# Patient Record
Sex: Female | Born: 1975 | Hispanic: No | Marital: Married
Health system: Southern US, Community
[De-identification: ages and names within clinical notes are randomized; demographics above are authoritative.]

---

## 2019-05-03 ENCOUNTER — Encounter: Payer: Self-pay | Admitting: Certified Nurse Midwife

## 2019-05-27 ENCOUNTER — Other Ambulatory Visit: Payer: Self-pay | Admitting: Otolaryngology

## 2019-05-27 DIAGNOSIS — R221 Localized swelling, mass and lump, neck: Secondary | ICD-10-CM

## 2019-06-07 ENCOUNTER — Ambulatory Visit: Admission: RE | Admit: 2019-06-07 | Payer: BC Managed Care – PPO | Source: Ambulatory Visit

## 2019-06-15 ENCOUNTER — Other Ambulatory Visit: Payer: Self-pay | Admitting: Otolaryngology

## 2019-06-15 ENCOUNTER — Ambulatory Visit
Admission: RE | Admit: 2019-06-15 | Discharge: 2019-06-15 | Disposition: A | Discharge status: Home / Self Care | Payer: Self-pay | Source: Ambulatory Visit | Attending: Otolaryngology | Admitting: Otolaryngology

## 2019-06-15 DIAGNOSIS — R221 Localized swelling, mass and lump, neck: Secondary | ICD-10-CM

## 2019-06-18 ENCOUNTER — Other Ambulatory Visit: Payer: Self-pay | Admitting: Radiology

## 2019-06-18 ENCOUNTER — Other Ambulatory Visit: Payer: Self-pay | Admitting: Student

## 2019-06-21 ENCOUNTER — Ambulatory Visit
Admission: RE | Admit: 2019-06-21 | Discharge: 2019-06-21 | Disposition: A | Discharge status: Home / Self Care | Payer: BC Managed Care – PPO | Source: Ambulatory Visit | Attending: Otolaryngology | Admitting: Otolaryngology

## 2019-06-21 ENCOUNTER — Other Ambulatory Visit: Payer: Self-pay

## 2019-06-21 DIAGNOSIS — R221 Localized swelling, mass and lump, neck: Secondary | ICD-10-CM | POA: Diagnosis present

## 2019-06-21 NOTE — Progress Notes (Signed)
Pt stable after Right neck mass [place] FNA.VSS.Neck stable.D/C instructions given.F/U with ENT.

## 2019-06-21 NOTE — Discharge Instructions (Signed)
Needle Biopsy, Care After °These instructions tell you how to care for yourself after your procedure. Your doctor may also give you more specific instructions. Call your doctor if you have any problems or questions. °What can I expect after the procedure? °After the procedure, it is common to have: °· Soreness. °· Bruising. °· Mild pain. °Follow these instructions at home: ° °· Return to your normal activities as told by your doctor. Ask your doctor what activities are safe for you. °· Take over-the-counter and prescription medicines only as told by your doctor. °· Wash your hands with soap and water before you change your bandage (dressing). If you cannot use soap and water, use hand sanitizer. °· Follow instructions from your doctor about: °? How to take care of your puncture site. °? When and how to change your bandage. °? When to remove your bandage. °· Check your puncture site every day for signs of infection. Watch for: °? Redness, swelling, or pain. °? Fluid or blood.  °? Pus or a bad smell. °? Warmth. °· Do not take baths, swim, or use a hot tub until your doctor approves. Ask your doctor if you may take showers. You may only be allowed to take sponge baths. °· Keep all follow-up visits as told by your doctor. This is important. °Contact a doctor if you have: °· A fever. °· Redness, swelling, or pain at the puncture site, and it lasts longer than a few days. °· Fluid, blood, or pus coming from the puncture site. °· Warmth coming from the puncture site. °Get help right away if: °· You have a lot of bleeding from the puncture site. °Summary °· After the procedure, it is common to have soreness, bruising, or mild pain at the puncture site. °· Check your puncture site every day for signs of infection, such as redness, swelling, or pain. °· Get help right away if you have severe bleeding from your puncture site. °This information is not intended to replace advice given to you by your health care provider. Make  sure you discuss any questions you have with your health care provider. °Document Released: 08/22/2008 Document Revised: 09/22/2017 Document Reviewed: 09/22/2017 °Elsevier Patient Education © 2020 [employer] ° °

## 2019-06-21 NOTE — Progress Notes (Signed)
Patient clinically stable post Neck biopsy per Dr Register, tolerated well without sedation per patient's wishes. Denies complaints at this time. Discharge instructions given with questions answered. bandaid dressing dry and intact.taking po's without difficulty.

## 2019-06-23 LAB — CYTOLOGY - NON PAP

## 2019-06-26 LAB — AEROBIC/ANAEROBIC CULTURE W GRAM STAIN (SURGICAL/DEEP WOUND)

## 2019-07-08 ENCOUNTER — Other Ambulatory Visit
Admission: RE | Admit: 2019-07-08 | Discharge: 2019-07-08 | Disposition: A | Discharge status: Home / Self Care | Payer: BC Managed Care – PPO | Source: Ambulatory Visit | Attending: Otolaryngology | Admitting: Otolaryngology

## 2019-07-09 ENCOUNTER — Inpatient Hospital Stay: Admission: RE | Admit: 2019-07-09 | Payer: BC Managed Care – PPO | Source: Ambulatory Visit

## 2019-07-09 ENCOUNTER — Other Ambulatory Visit
Admission: RE | Admit: 2019-07-09 | Discharge: 2019-07-09 | Disposition: A | Discharge status: Home / Self Care | Payer: BC Managed Care – PPO | Source: Ambulatory Visit | Attending: Otolaryngology | Admitting: Otolaryngology

## 2019-07-09 DIAGNOSIS — Z20828 Contact with and (suspected) exposure to other viral communicable diseases: Secondary | ICD-10-CM | POA: Diagnosis not present

## 2019-07-09 LAB — SARS CORONAVIRUS 2 (TAT 6-24 HRS): SARS Coronavirus 2: NEGATIVE

## 2019-07-14 ENCOUNTER — Encounter: Admission: RE | Payer: Self-pay | Source: Home / Self Care

## 2019-07-14 ENCOUNTER — Ambulatory Visit: Admission: RE | Admit: 2019-07-14 | Payer: BC Managed Care – PPO | Source: Home / Self Care | Admitting: Otolaryngology

## 2019-07-14 SURGERY — EXCISION, BRANCHIAL CLEFT CYST
Anesthesia: General | Laterality: Right

## 2019-12-30 ENCOUNTER — Other Ambulatory Visit: Payer: Self-pay | Admitting: Obstetrics and Gynecology

## 2019-12-30 DIAGNOSIS — N6310 Unspecified lump in the right breast, unspecified quadrant: Secondary | ICD-10-CM

## 2020-01-14 ENCOUNTER — Other Ambulatory Visit: Payer: Self-pay | Admitting: Obstetrics and Gynecology

## 2020-01-14 DIAGNOSIS — N6310 Unspecified lump in the right breast, unspecified quadrant: Secondary | ICD-10-CM

## 2020-01-24 ENCOUNTER — Ambulatory Visit
Admission: RE | Admit: 2020-01-24 | Discharge: 2020-01-24 | Disposition: A | Discharge status: Home / Self Care | Payer: BC Managed Care – PPO | Source: Ambulatory Visit | Attending: Obstetrics and Gynecology | Admitting: Obstetrics and Gynecology

## 2020-01-24 DIAGNOSIS — N6310 Unspecified lump in the right breast, unspecified quadrant: Secondary | ICD-10-CM

## 2020-06-07 ENCOUNTER — Other Ambulatory Visit: Payer: Self-pay | Admitting: Obstetrics

## 2020-06-07 DIAGNOSIS — Z1231 Encounter for screening mammogram for malignant neoplasm of breast: Secondary | ICD-10-CM

## 2020-06-16 ENCOUNTER — Ambulatory Visit: Payer: BC Managed Care – PPO | Attending: Obstetrics

## 2022-02-14 IMAGING — US US BREAST*R* LIMITED INC AXILLA
1 series · 2 of 2 positions shown · non-contrast
Comparison: 04/29/2019

CLINICAL DATA: Patient found a mass in the RIGHT breast measuring
pea-sized 5 weeks ago. Patient reports mass is now smaller. Patient
is currently breast-feeding an 18-month-old child.

EXAM:
DIGITAL DIAGNOSTIC RIGHT MAMMOGRAM WITH CAD AND TOMO
ULTRASOUND RIGHT BREAST

[Series 1: us breast*right* limited inc axilla · 0.07mm/px · 2 of 2 slices shown]
[im 1/2]
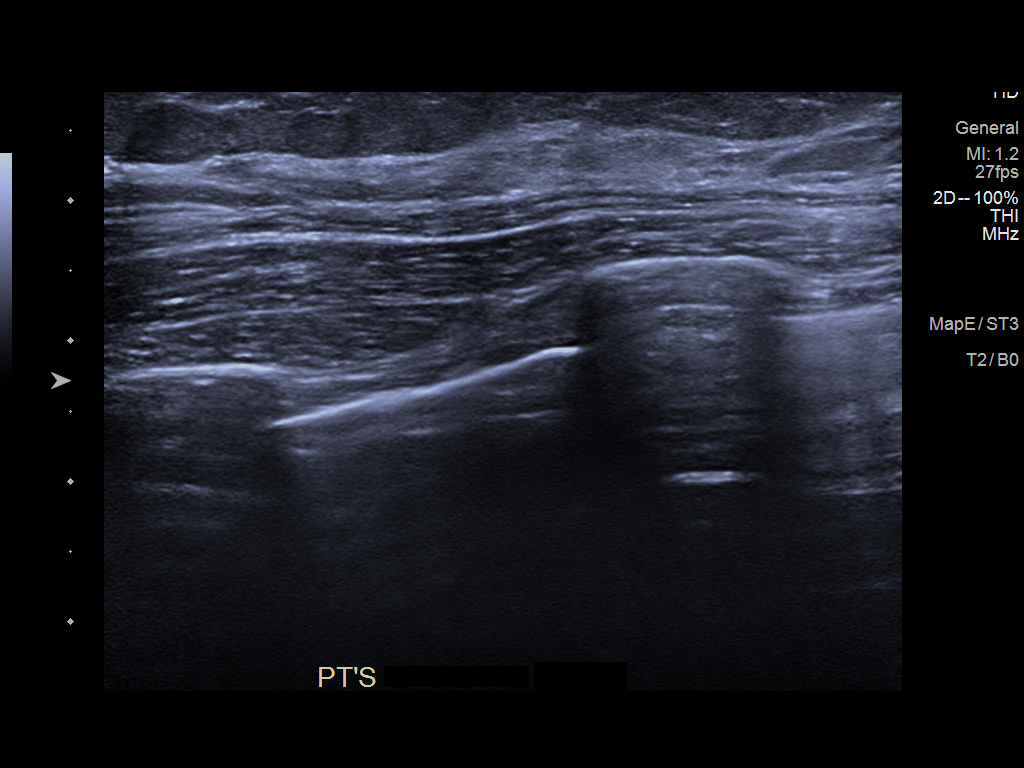
[im 2/2]
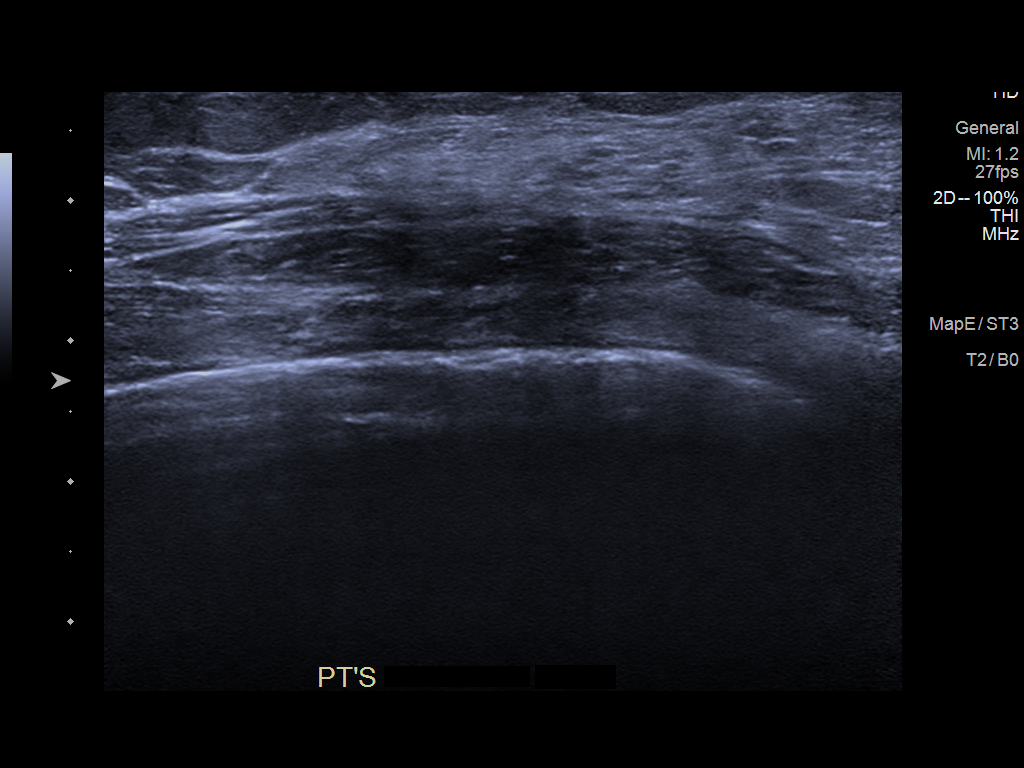

[2 of 2 positions shown; findings below may reference images not displayed]

ACR Breast Density Category c: The breast tissue is heterogeneously
dense, which may obscure small masses.
FINDINGS: No suspicious mass, distortion, or microcalcifications are
identified to suggest presence of malignancy. Spot tangential view
is performed area concern in the LATERAL portion of the RIGHT
breast, showing normal appearing fibroglandular tissue. A possible
mass in the LATERAL portion of the RIGHT breast disperses on
dedicated spot compression views.

Mammographic images were processed with CAD.

On physical exam, I palpate no discrete mass in 9 o'clock location
of the RIGHT breast.

Targeted ultrasound is performed, showing normal appearing
fibroglandular tissue in the LATERAL aspect of the RIGHT breast. No
suspicious mass, distortion, or acoustic shadowing is demonstrated
with ultrasound.
IMPRESSION: No mammographic or ultrasound evidence for malignancy.

RECOMMENDATION:
Bilateral screening mammogram is recommended in April 2020.

I have discussed the findings and recommendations with the patient.
If applicable, a reminder letter will be sent to the patient
regarding the next appointment.

BI-RADS CATEGORY  1: Negative.

## 2022-02-14 IMAGING — MG MM DIGITAL DIAGNOSTIC UNILAT*R* W/ TOMO W/ CAD
6 of 10 series · 6 of 30 positions shown · non-contrast
Comparison: 04/29/2019

CLINICAL DATA: Patient found a mass in the RIGHT breast measuring
pea-sized 5 weeks ago. Patient reports mass is now smaller. Patient
is currently breast-feeding an 18-month-old child.

EXAM:
DIGITAL DIAGNOSTIC RIGHT MAMMOGRAM WITH CAD AND TOMO
ULTRASOUND RIGHT BREAST

[R CC synth-2D (1 of 3)]
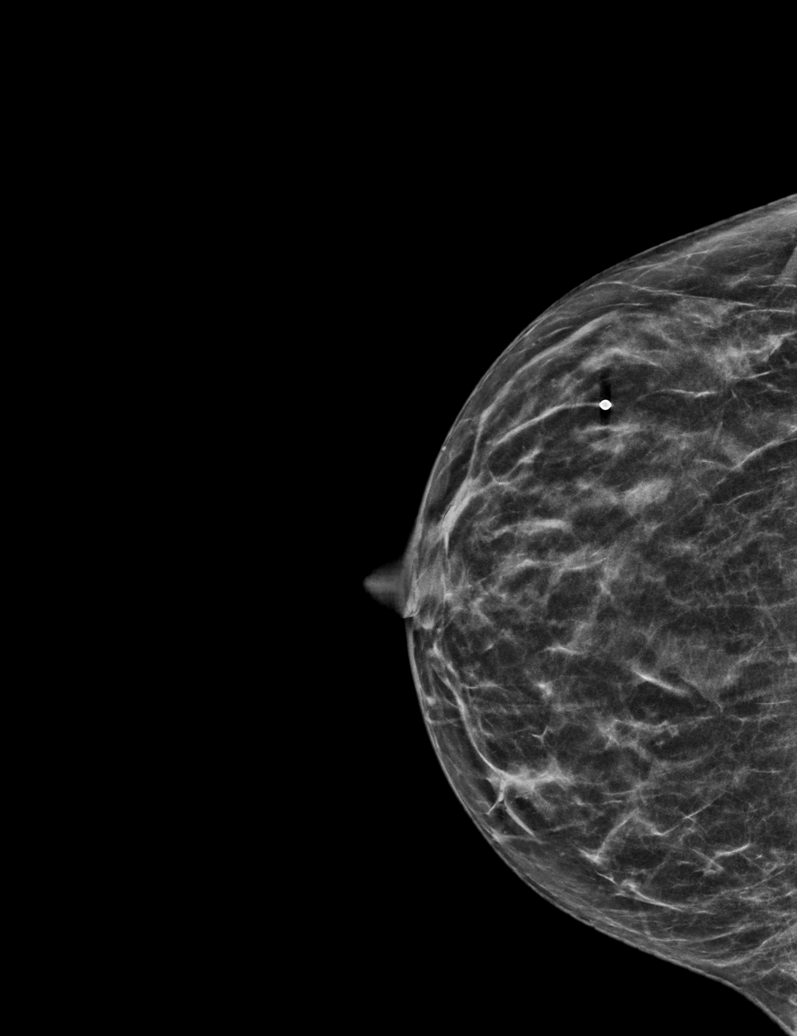

[R CC synth-2D (2 of 3)]
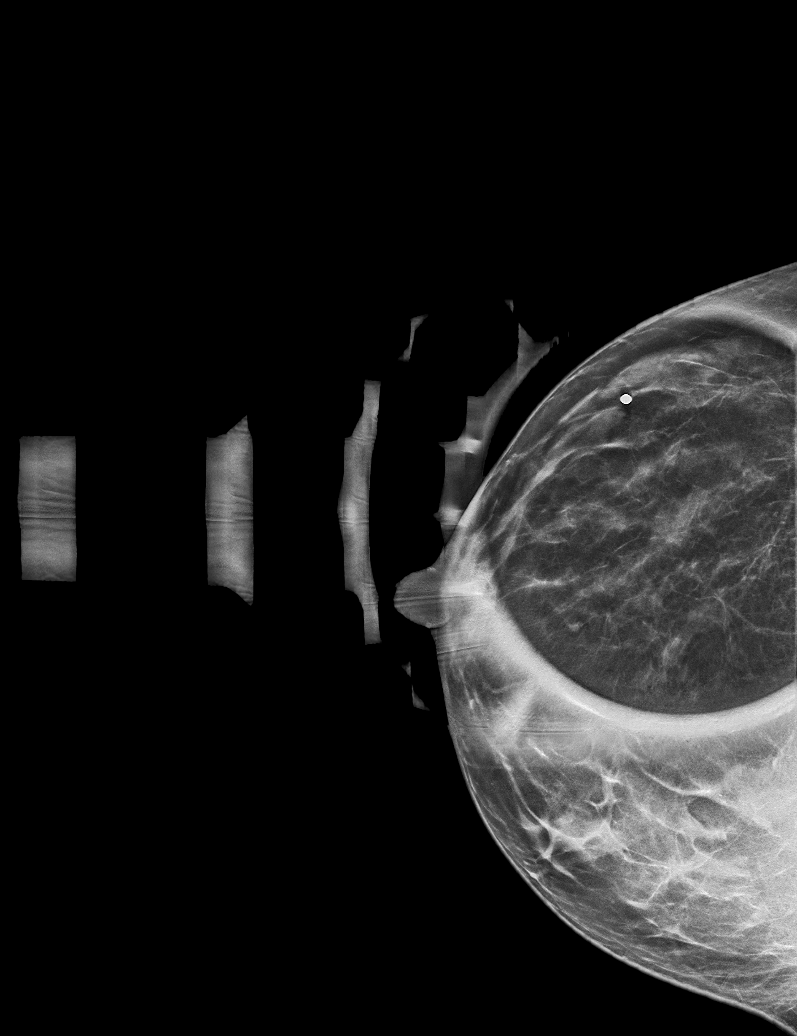

[R MLO synth-2D]
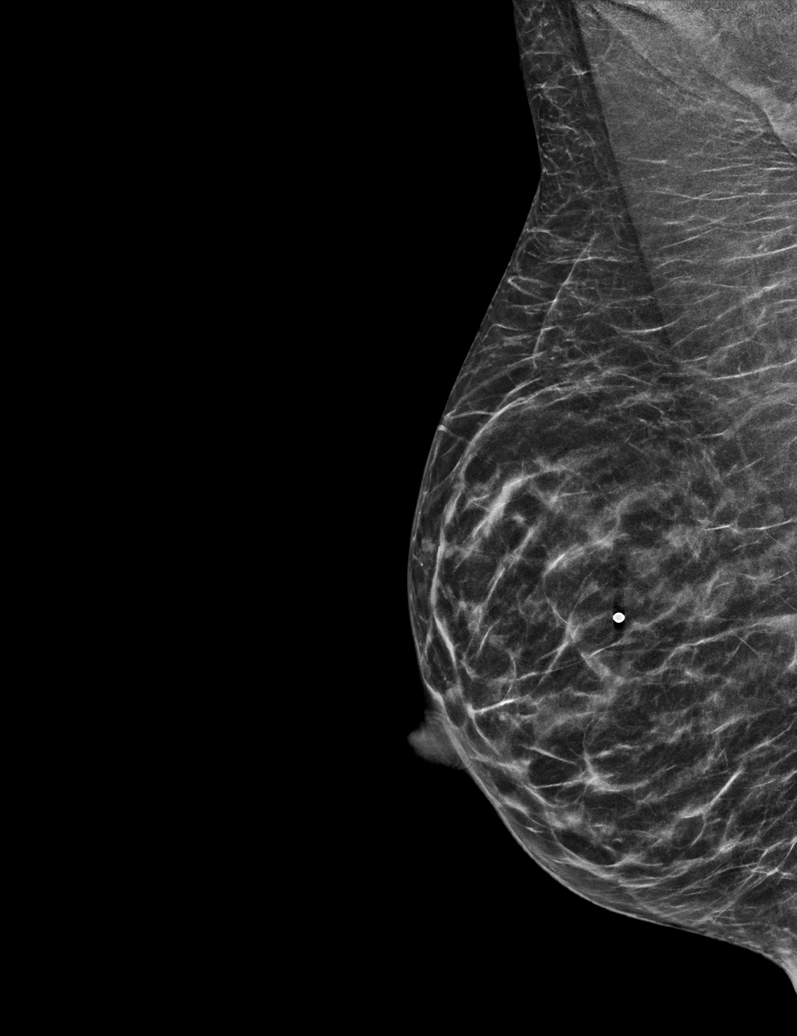

[R CC synth-2D (3 of 3)]
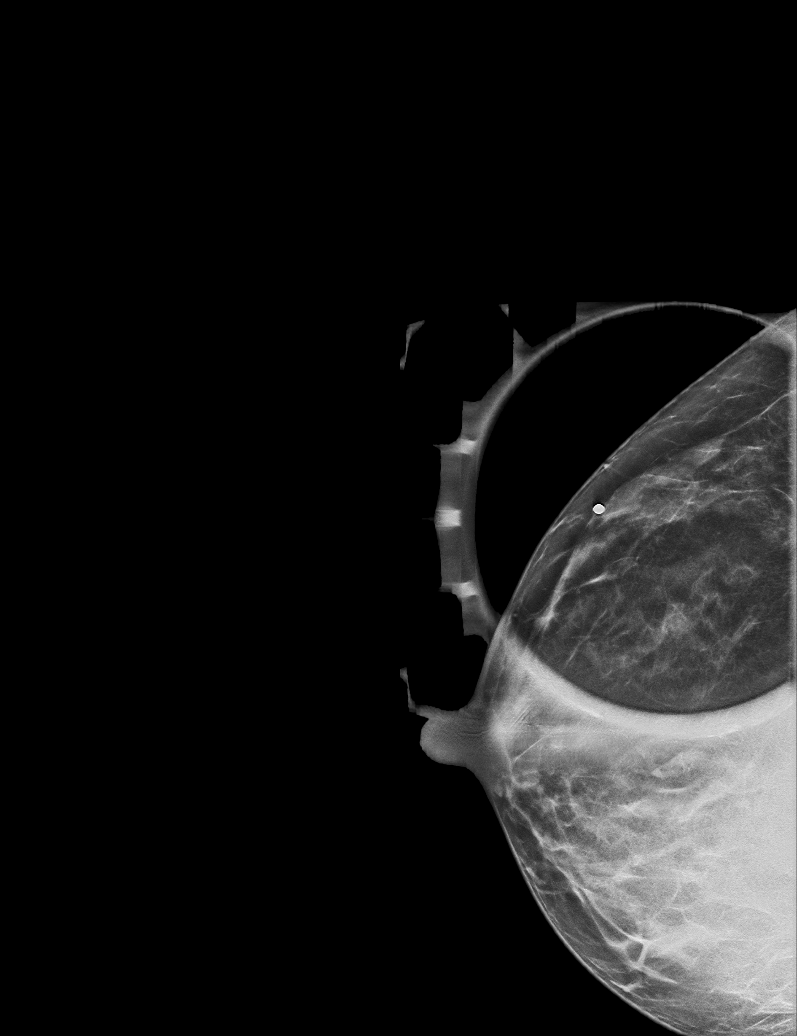

[R ML synth-2D]
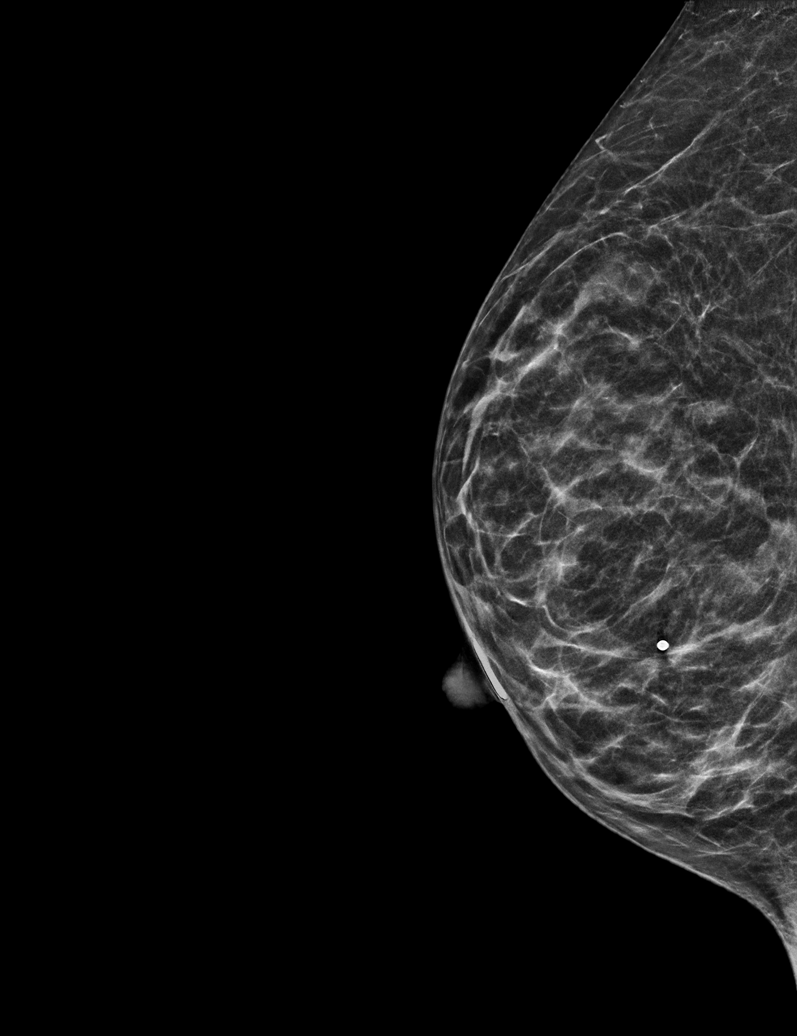

[R MLO tomo · tomo slice 23/46.0]
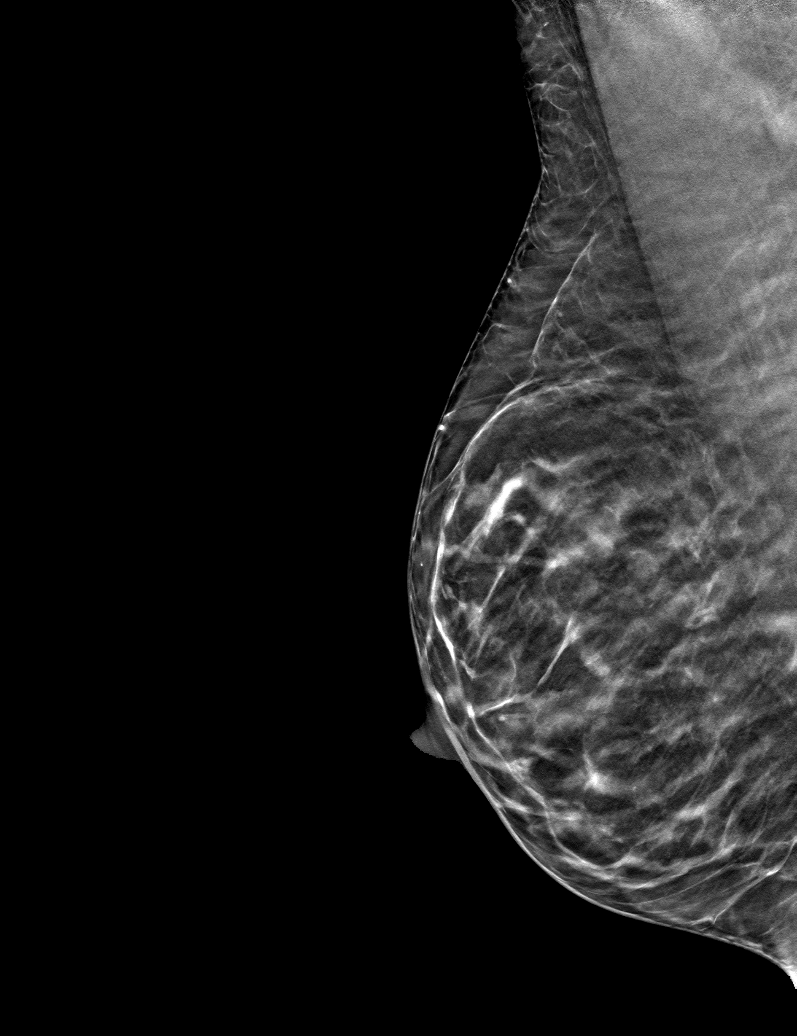

[6 of 30 positions shown; findings below may reference images not displayed]

ACR Breast Density Category c: The breast tissue is heterogeneously
dense, which may obscure small masses.
FINDINGS: No suspicious mass, distortion, or microcalcifications are
identified to suggest presence of malignancy. Spot tangential view
is performed area concern in the LATERAL portion of the RIGHT
breast, showing normal appearing fibroglandular tissue. A possible
mass in the LATERAL portion of the RIGHT breast disperses on
dedicated spot compression views.

Mammographic images were processed with CAD.

On physical exam, I palpate no discrete mass in 9 o'clock location
of the RIGHT breast.

Targeted ultrasound is performed, showing normal appearing
fibroglandular tissue in the LATERAL aspect of the RIGHT breast. No
suspicious mass, distortion, or acoustic shadowing is demonstrated
with ultrasound.
IMPRESSION: No mammographic or ultrasound evidence for malignancy.

RECOMMENDATION:
Bilateral screening mammogram is recommended in April 2020.

I have discussed the findings and recommendations with the patient.
If applicable, a reminder letter will be sent to the patient
regarding the next appointment.

BI-RADS CATEGORY  1: Negative.

## 2022-05-20 ENCOUNTER — Other Ambulatory Visit: Payer: Self-pay | Admitting: Infectious Diseases

## 2022-05-20 DIAGNOSIS — Z1231 Encounter for screening mammogram for malignant neoplasm of breast: Secondary | ICD-10-CM

## 2022-06-07 ENCOUNTER — Ambulatory Visit
Admission: RE | Admit: 2022-06-07 | Discharge: 2022-06-07 | Disposition: A | Discharge status: Home / Self Care | Payer: BC Managed Care – PPO | Source: Ambulatory Visit | Attending: Infectious Diseases | Admitting: Infectious Diseases

## 2022-06-07 ENCOUNTER — Encounter: Payer: Self-pay | Admitting: Radiology

## 2022-06-07 DIAGNOSIS — Z1231 Encounter for screening mammogram for malignant neoplasm of breast: Secondary | ICD-10-CM

## 2023-07-15 ENCOUNTER — Ambulatory Visit
Admission: RE | Admit: 2023-07-15 | Discharge: 2023-07-15 | Disposition: A | Discharge status: Home / Self Care | Payer: BC Managed Care – PPO | Source: Ambulatory Visit | Attending: Infectious Diseases | Admitting: Infectious Diseases

## 2023-07-15 ENCOUNTER — Other Ambulatory Visit: Payer: Self-pay | Admitting: Infectious Diseases

## 2023-07-15 DIAGNOSIS — Z1231 Encounter for screening mammogram for malignant neoplasm of breast: Secondary | ICD-10-CM

## 2024-07-14 ENCOUNTER — Other Ambulatory Visit: Payer: Self-pay | Admitting: Obstetrics and Gynecology

## 2024-07-14 DIAGNOSIS — Z1231 Encounter for screening mammogram for malignant neoplasm of breast: Secondary | ICD-10-CM

## 2024-07-21 ENCOUNTER — Encounter

## 2024-07-21 ENCOUNTER — Inpatient Hospital Stay: Admission: RE | Admit: 2024-07-21 | Source: Ambulatory Visit

## 2024-07-26 ENCOUNTER — Ambulatory Visit

## 2024-07-26 ENCOUNTER — Ambulatory Visit
Admission: RE | Admit: 2024-07-26 | Discharge: 2024-07-26 | Disposition: A | Discharge status: Home / Self Care | Source: Ambulatory Visit | Attending: Obstetrics and Gynecology | Admitting: Obstetrics and Gynecology

## 2024-07-26 DIAGNOSIS — D229 Melanocytic nevi, unspecified: Secondary | ICD-10-CM

## 2024-07-26 DIAGNOSIS — L821 Other seborrheic keratosis: Secondary | ICD-10-CM

## 2024-07-26 DIAGNOSIS — D1801 Hemangioma of skin and subcutaneous tissue: Secondary | ICD-10-CM | POA: Diagnosis not present

## 2024-07-26 DIAGNOSIS — L814 Other melanin hyperpigmentation: Secondary | ICD-10-CM | POA: Diagnosis not present

## 2024-07-26 DIAGNOSIS — Z1283 Encounter for screening for malignant neoplasm of skin: Secondary | ICD-10-CM

## 2024-07-26 DIAGNOSIS — L811 Chloasma: Secondary | ICD-10-CM

## 2024-07-26 DIAGNOSIS — W908XXA Exposure to other nonionizing radiation, initial encounter: Secondary | ICD-10-CM

## 2024-07-26 DIAGNOSIS — Z1231 Encounter for screening mammogram for malignant neoplasm of breast: Secondary | ICD-10-CM

## 2024-07-26 DIAGNOSIS — L578 Other skin changes due to chronic exposure to nonionizing radiation: Secondary | ICD-10-CM

## 2024-07-26 NOTE — Progress Notes (Signed)
 Subjective   Tabitha Griffin is a 48 y.o. female who presents for the following: Total body skin exam for skin cancer screening and mole check. The patient has spots, moles and lesions to be evaluated, some may be new or changing and the patient may have concern these could be cancer.. Patient is new patient  Today patient reports: No areas of concern today.  Review of Systems:    No other skin or systemic complaints except as noted in HPI or Assessment and Plan.  The following portions of the chart were reviewed this encounter and updated as appropriate: medications, allergies, medical history  Relevant Medical History:  n/a   Objective  Well appearing patient in no apparent distress; mood and affect are within normal limits. Examination was performed of the: Full Skin Examination: scalp, head, eyes, ears, nose, lips, neck, chest, axillae, abdomen, back, buttocks, bilateral upper extremities, bilateral lower extremities, hands, feet, fingers, toes, fingernails, and toenails.   Examination notable for: SKIN EXAM, Angioma(s): Scattered red vascular papule(s)  , Lentigo/lentigines: Scattered pigmented macules that are tan to brown in color and are somewhat non-uniform in shape and concentrated in the sun-exposed areas, Nevus/nevi: Scattered well-demarcated, regular, pigmented macule(s) and/or papule(s)  , Seborrheic Keratosis(es): Stuck-on appearing keratotic papule(s) on the trunk, none  irritated with redness, crusting, edema, and/or partial avulsion, Actinic Damage/Elastosis: chronic sun damage: dyspigmentation, telangiectasia, and wrinkling, Melasma: Hyperpigmented macules and patches with coalescence located symmetrically on the cheeks and upper lip of the face   Examination limited by: Undergarments and Patient deferred removal       Assessment & Plan   SKIN CANCER SCREENING PERFORMED TODAY.  BENIGN SKIN FINDINGS  - Lentigines  - Seborrheic keratoses  - Hemangiomas   -  Nevus/Multiple Benign Nevi - Reassurance provided regarding the benign appearance of lesions noted on exam today; no treatment is indicated in the absence of symptoms/changes. - Reinforced importance of photoprotective strategies including liberal and frequent sunscreen use of a broad-spectrum SPF 30 or greater, use of protective clothing, and sun avoidance for prevention of cutaneous malignancy and photoaging.  Counseled patient on the importance of regular self-skin monitoring as well as routine clinical skin examinations as scheduled.   ACTINIC DAMAGE - Chronic condition, secondary to cumulative UV/sun exposure - Recommend daily broad spectrum sunscreen SPF 30+ to sun-exposed areas, reapply every 2 hours as needed.  - Staying in the shade or wearing long sleeves, sun glasses (UVA+UVB protection) and wide brim hats (4-inch brim around the entire circumference of the hat) are also recommended for sun protection.  - Call for new or changing lesions.  Melasma Chronic and persistent condition with duration or expected duration over one year. Condition is symptomatic/ bothersome to patient. Not currently at goal. - explained the etiology of the disorder, its difficulty to treat, and chronic nature - Strict sun protection with sun avoidance and tinted broad-spectrum sunscreen (with visible light protection), ideally SPF 50+, but at least 30+ (provided handout with recommendations). Warned that even 10 minutes of unprotected sun exposure while clear can flare melasma - Counseled that this is a chronic condition that will likely resolve in later years of life - Counseled on goals of treatment - primarily prevention of flares and minimizing pigmentation, not necessarily complete clearance - recommended diligent sun protection - Discussed starting compounded topical medication (Hydroquinone 8%, Tretinoin 0.025%, Kojic Acid 1%, Niacinamide 4%, Fluocinolone 0.025% Cream) from [facility] - pt defers for  now but will consider in the future  -  Continue vitamin C in the morning - Eucerin thiamidol      Level of service outlined above   Procedures, orders, diagnosis for this visit:    There are no diagnoses linked to this encounter.  Return to clinic: Return in about 1 year (around 07/26/2025) for TBSE.  I, Emerick Ege, CMA am acting as scribe for Lauraine JAYSON Kanaris, MD.   Documentation: I have reviewed the above documentation for accuracy and completeness, and I agree with the above.  Lauraine JAYSON Kanaris, MD

## 2024-07-26 NOTE — Patient Instructions (Addendum)
 Recommended sunscreens for face for patients with melasma: - La Roche-Posay Anthelios Mineral SPF 50 Sunscreen Tinted - SkinCeuticals Physical Fusion UV Defense Tinted Mineral - EltaMD UV Physical Tinted Facial Suncreen SPF 41 - Avene Mineral High Protection Tinted Compact SPF 50 -- this comes in darker tints    Melanoma ABCDEs  Melanoma is the most dangerous type of skin cancer, and is the leading cause of death from skin disease.  You are more likely to develop melanoma if you: Have light-colored skin, light-colored eyes, or red or blond hair Spend a lot of time in the sun Tan regularly, either outdoors or in a tanning bed Have had blistering sunburns, especially during childhood Have a close family member who has had a melanoma Have atypical moles or large birthmarks  Early detection of melanoma is key since treatment is typically straightforward and cure rates are extremely high if we catch it early.   The first sign of melanoma is often a change in a mole or a new dark spot.  The ABCDE system is a way of remembering the signs of melanoma.  A for asymmetry:  The two halves do not match. B for border:  The edges of the growth are irregular. C for color:  A mixture of colors are present instead of an even brown color. D for diameter:  Melanomas are usually (but not always) greater than 6mm - the size of a pencil eraser. E for evolution:  The spot keeps changing in size, shape, and color.  Please check your skin once per month between visits. You can use a small mirror in front and a large mirror behind you to keep an eye on the back side or your body.   If you see any new or changing lesions before your next follow-up, please call to schedule a visit.  Please continue daily skin protection including broad spectrum sunscreen SPF 30+ to sun-exposed areas, reapplying every 2 hours as needed when you're outdoors.     Due to recent changes in healthcare laws, you may see results of  your pathology and/or laboratory studies on MyChart before the doctors have had a chance to review them. We understand that in some cases there may be results that are confusing or concerning to you. Please understand that not all results are received at the same time and often the doctors may need to interpret multiple results in order to provide you with the best plan of care or course of treatment. Therefore, we ask that you please give us  2 business days to thoroughly review all your results before contacting the office for clarification. Should we see a critical lab result, you will be contacted sooner.   If You Need Anything After Your Visit  If you have any questions or concerns for your doctor, please call our main line at [phone] and press option 4 to reach your doctor's medical assistant. If no one answers, please leave a voicemail as directed and we will return your call as soon as possible. Messages left after 4 pm will be answered the following business day.   You may also send us  a message via MyChart. We typically respond to MyChart messages within 1-2 business days.  For prescription refills, please ask your pharmacy to contact our office. Our fax number is [phone].  If you have an urgent issue when the clinic is closed that cannot wait until the next business day, you can page your doctor at the number below.  Please note that while we do our best to be available for urgent issues outside of office hours, we are not available 24/7.   If you have an urgent issue and are unable to reach us , you may choose to seek medical care at your doctor's office, retail clinic, urgent care center, or emergency room.  If you have a medical emergency, please immediately call 911 or go to the emergency department.  Pager Numbers  - Dr. Hester: [phone]  - Dr. Jackquline: [phone]  - Dr. Claudene: [phone]   - Dr. Raymund: [phone]  In the event of inclement weather,  please call our main line at [phone] for an update on the status of any delays or closures.  Dermatology Medication Tips: Please keep the boxes that topical medications come in in order to help keep track of the instructions about where and how to use these. Pharmacies typically print the medication instructions only on the boxes and not directly on the medication tubes.   If your medication is too expensive, please contact our office at [phone] option 4 or send us  a message through MyChart.   We are unable to tell what your co-pay for medications will be in advance as this is different depending on your insurance coverage. However, we may be able to find a substitute medication at lower cost or fill out paperwork to get insurance to cover a needed medication.   If a prior authorization is required to get your medication covered by your insurance company, please allow us  1-2 business days to complete this process.  Drug prices often vary depending on where the prescription is filled and some pharmacies may offer cheaper prices.  The website www.goodrx.com contains coupons for medications through different pharmacies. The prices here do not account for what the cost may be with help from insurance (it may be cheaper with your insurance), but the website can give you the price if you did not use any insurance.  - You can print the associated coupon and take it with your prescription to the pharmacy.  - You may also stop by our office during regular business hours and pick up a GoodRx coupon card.  - If you need your prescription sent electronically to a different pharmacy, notify our office through [facility] or by phone at [phone] option 4.     Si Usted Necesita Algo Despus de Su Visita  Tambin puede enviarnos un mensaje a travs de Clinical Cytogeneticist. Por lo general respondemos a los mensajes de MyChart en el transcurso de 1 a 2 das hbiles.  Para renovar recetas, por favor  pida a su farmacia que se ponga en contacto con nuestra oficina. Randi lakes de fax es [place] [phone].  Si tiene un asunto urgente cuando la clnica est cerrada y que no puede esperar hasta el siguiente da hbil, puede llamar/localizar a su doctor(a) al nmero que aparece a continuacin.   Por favor, tenga en cuenta que aunque hacemos todo lo posible para estar disponibles para asuntos urgentes fuera del horario de [place], no estamos disponibles las 24 horas del da, los 7 [address] de la [place].   Si tiene un problema urgente y no puede comunicarse con nosotros, puede optar por buscar atencin mdica  en el consultorio de su doctor(a), en una clnica privada, en un centro de atencin urgente o en una sala de emergencias.  Si tiene engineer, drilling, por favor llame inmediatamente al 911 o vaya a la sala de emergencias.  Nmeros  de bper  - Dr. Hester: [phone]  - Dra. Jackquline: [phone]  - Dr. Claudene: [phone]  - Dra. Kitts: [phone]  En caso de inclemencias del [place], por favor llame a nuestra lnea principal al [phone] para una actualizacin sobre el estado de cualquier retraso o cierre.  Consejos para la medicacin en dermatologa: Por favor, guarde las cajas en las que vienen los medicamentos de uso tpico para ayudarle a seguir las instrucciones sobre dnde y cmo usarlos. Las farmacias generalmente imprimen las instrucciones del medicamento slo en las cajas y no directamente en los tubos del [place].   Si su medicamento es muy caro, por favor, pngase en contacto con landry rieger llamando al [phone] y presione la opcin 4 o envenos un mensaje a travs de Clinical Cytogeneticist.   No podemos decirle cul ser su copago por los medicamentos por adelantado ya que esto es diferente dependiendo de la cobertura de su seguro. Sin embargo, es posible que podamos encontrar un medicamento sustituto a audiological scientist un formulario para que el seguro cubra el  medicamento que se considera necesario.   Si se requiere una autorizacin previa para que su compaa de seguros cubra su medicamento, por favor permtanos de 1 a 2 das hbiles para completar este proceso.  Los precios de los medicamentos varan con frecuencia dependiendo del environmental consultant de dnde se surte la receta y alguna farmacias pueden ofrecer precios ms baratos.  El sitio web www.goodrx.com tiene cupones para medicamentos de health and safety inspector. Los precios aqu no tienen en cuenta lo que podra costar con la ayuda del seguro (puede ser ms barato con su seguro), pero el sitio web puede darle el precio si no utiliz tourist information centre manager.  - Puede imprimir el cupn correspondiente y llevarlo con su receta a la farmacia.  - Tambin puede pasar por nuestra oficina durante el horario de atencin regular y education officer, museum una tarjeta de cupones de GoodRx.  - Si necesita que su receta se enve electrnicamente a una farmacia diferente, informe a nuestra oficina a travs de MyChart de [place] o por telfono llamando al [phone] y presione la opcin 4.
